# Patient Record
Sex: Male | Born: 1990 | Race: White | Hispanic: No | Marital: Single
Health system: Southern US, Community
[De-identification: ages and names within clinical notes are randomized; demographics above are authoritative.]

## PROBLEM LIST (undated history)

## (undated) DIAGNOSIS — J9621 Acute and chronic respiratory failure with hypoxia: Secondary | ICD-10-CM

## (undated) DIAGNOSIS — S069X9A Unspecified intracranial injury with loss of consciousness of unspecified duration, initial encounter: Secondary | ICD-10-CM

## (undated) DIAGNOSIS — F0781 Postconcussional syndrome: Secondary | ICD-10-CM

## (undated) DIAGNOSIS — S06309S Unspecified focal traumatic brain injury with loss of consciousness of unspecified duration, sequela: Secondary | ICD-10-CM

## (undated) DIAGNOSIS — I479 Paroxysmal tachycardia, unspecified: Secondary | ICD-10-CM

---

## 2019-02-23 ENCOUNTER — Other Ambulatory Visit (HOSPITAL_COMMUNITY): Payer: BC Managed Care – PPO

## 2019-02-23 ENCOUNTER — Inpatient Hospital Stay
Admission: AD | Admit: 2019-02-23 | Discharge: 2019-03-23 | Disposition: A | Discharge status: Rehabilitation Facility | Payer: BC Managed Care – PPO | Source: Other Acute Inpatient Hospital | Attending: Internal Medicine | Admitting: Internal Medicine

## 2019-02-23 DIAGNOSIS — I479 Paroxysmal tachycardia, unspecified: Secondary | ICD-10-CM | POA: Diagnosis present

## 2019-02-23 DIAGNOSIS — R52 Pain, unspecified: Secondary | ICD-10-CM

## 2019-02-23 DIAGNOSIS — S069X9A Unspecified intracranial injury with loss of consciousness of unspecified duration, initial encounter: Secondary | ICD-10-CM | POA: Diagnosis present

## 2019-02-23 DIAGNOSIS — Z431 Encounter for attention to gastrostomy: Secondary | ICD-10-CM

## 2019-02-23 DIAGNOSIS — F0781 Postconcussional syndrome: Secondary | ICD-10-CM | POA: Diagnosis present

## 2019-02-23 DIAGNOSIS — J9621 Acute and chronic respiratory failure with hypoxia: Secondary | ICD-10-CM | POA: Diagnosis present

## 2019-02-23 DIAGNOSIS — T17908A Unspecified foreign body in respiratory tract, part unspecified causing other injury, initial encounter: Secondary | ICD-10-CM

## 2019-02-23 DIAGNOSIS — S06309S Unspecified focal traumatic brain injury with loss of consciousness of unspecified duration, sequela: Secondary | ICD-10-CM

## 2019-02-23 DIAGNOSIS — Z931 Gastrostomy status: Secondary | ICD-10-CM

## 2019-02-23 DIAGNOSIS — T1490XA Injury, unspecified, initial encounter: Secondary | ICD-10-CM

## 2019-02-23 DIAGNOSIS — J9690 Respiratory failure, unspecified, unspecified whether with hypoxia or hypercapnia: Secondary | ICD-10-CM

## 2019-02-23 HISTORY — DX: Unspecified focal traumatic brain injury with loss of consciousness of unspecified duration, sequela: S06.309S

## 2019-02-23 HISTORY — DX: Postconcussional syndrome: F07.81

## 2019-02-23 HISTORY — DX: Unspecified intracranial injury with loss of consciousness of unspecified duration, initial encounter: S06.9X9A

## 2019-02-23 HISTORY — DX: Paroxysmal tachycardia, unspecified: I47.9

## 2019-02-23 HISTORY — DX: Acute and chronic respiratory failure with hypoxia: J96.21

## 2019-02-23 MED ORDER — IOHEXOL 300 MG/ML  SOLN: 25.0000 mL | Freq: Once | INTRAMUSCULAR | Status: DC | PRN

## 2019-02-24 LAB — COMPREHENSIVE METABOLIC PANEL
ALT: 23 U/L (ref 0–44)
AST: 22 U/L (ref 15–41)
Albumin: 2.7 g/dL — ABNORMAL LOW (ref 3.5–5.0)
Alkaline Phosphatase: 75 U/L (ref 38–126)
Anion gap: 12 (ref 5–15)
BUN: 19 mg/dL (ref 6–20)
CO2: 27 mmol/L (ref 22–32)
Calcium: 9.3 mg/dL (ref 8.9–10.3)
Chloride: 98 mmol/L (ref 98–111)
Creatinine, Ser: 0.55 mg/dL — ABNORMAL LOW (ref 0.61–1.24)
GFR calc Af Amer: >60 mL/min (ref >60)
GFR calc non Af Amer: >60 mL/min (ref >60)
Glucose, Bld: 120 mg/dL — ABNORMAL HIGH (ref 70–99)
Potassium: 4.4 mmol/L (ref 3.5–5.1)
Sodium: 137 mmol/L (ref 135–145)
Total Bilirubin: 0.5 mg/dL (ref 0.3–1.2)
Total Protein: 6.9 g/dL (ref 6.5–8.1)

## 2019-02-24 LAB — CBC
HCT: 28.7 % — ABNORMAL LOW (ref 39.0–52.0)
Hemoglobin: 9.2 g/dL — ABNORMAL LOW (ref 13.0–17.0)
MCH: 29.5 pg (ref 26.0–34.0)
MCHC: 32.1 g/dL (ref 30.0–36.0)
MCV: 92 fL (ref 80.0–100.0)
Platelets: 613 10*3/uL — ABNORMAL HIGH (ref 150–400)
RBC: 3.12 MIL/uL — ABNORMAL LOW (ref 4.22–5.81)
RDW: 12.3 % (ref 11.5–15.5)
WBC: 10.6 10*3/uL — ABNORMAL HIGH (ref 4.0–10.5)
nRBC: 0 % (ref 0.0–0.2)

## 2019-02-24 LAB — PROTIME-INR
INR: 1.2 (ref 0.8–1.2)
Prothrombin Time: 15.3 seconds — ABNORMAL HIGH (ref 11.4–15.2)

## 2019-02-25 LAB — VANCOMYCIN, TROUGH: Vancomycin Tr: 9 ug/mL — ABNORMAL LOW (ref 15–20)

## 2019-02-26 DIAGNOSIS — J9621 Acute and chronic respiratory failure with hypoxia: Secondary | ICD-10-CM | POA: Diagnosis not present

## 2019-02-26 DIAGNOSIS — F0781 Postconcussional syndrome: Secondary | ICD-10-CM | POA: Diagnosis not present

## 2019-02-26 DIAGNOSIS — S06309S Unspecified focal traumatic brain injury with loss of consciousness of unspecified duration, sequela: Secondary | ICD-10-CM

## 2019-02-26 DIAGNOSIS — I479 Paroxysmal tachycardia, unspecified: Secondary | ICD-10-CM

## 2019-02-26 MED ORDER — GENERIC EXTERNAL MEDICATION
Status: DC
Start: ? — End: 2019-02-26

## 2019-02-26 MED ORDER — RISPERIDONE 1 MG PO TABS
4.00 | ORAL_TABLET | ORAL | Status: DC
Start: 2019-02-23 — End: 2019-02-26

## 2019-02-26 MED ORDER — CYCLOBENZAPRINE HCL 5 MG PO TABS
10.00 | ORAL_TABLET | ORAL | Status: DC
Start: ? — End: 2019-02-26

## 2019-02-26 MED ORDER — PROPRANOLOL HCL 40 MG PO TABS
80.00 | ORAL_TABLET | ORAL | Status: DC
Start: 2019-02-23 — End: 2019-02-26

## 2019-02-26 MED ORDER — GENERIC EXTERNAL MEDICATION
5.00 | Status: DC
Start: ? — End: 2019-02-26

## 2019-02-26 MED ORDER — GENERIC EXTERNAL MEDICATION
Status: DC
Start: 2019-02-23 — End: 2019-02-26

## 2019-02-26 MED ORDER — CHLORHEXIDINE GLUCONATE 0.12 % MT SOLN
15.00 | OROMUCOSAL | Status: DC
Start: 2019-02-23 — End: 2019-02-26

## 2019-02-26 MED ORDER — HYDRALAZINE HCL 20 MG/ML IJ SOLN
5.00 | INTRAMUSCULAR | Status: DC
Start: ? — End: 2019-02-26

## 2019-02-26 MED ORDER — CARBOXYMETHYLCELLULOSE SODIUM 0.5 % OP SOLN
1.00 | OPHTHALMIC | Status: DC
Start: ? — End: 2019-02-26

## 2019-02-26 MED ORDER — ENOXAPARIN SODIUM 30 MG/0.3ML ~~LOC~~ SOLN
30.00 | SUBCUTANEOUS | Status: DC
Start: 2019-02-23 — End: 2019-02-26

## 2019-02-26 MED ORDER — LABETALOL HCL 5 MG/ML IV SOLN
10.00 | INTRAVENOUS | Status: DC
Start: ? — End: 2019-02-26

## 2019-02-26 MED ORDER — BACITRACIN ZINC 500 UNIT/GM EX OINT
TOPICAL_OINTMENT | CUTANEOUS | Status: DC
Start: 2019-02-23 — End: 2019-02-26

## 2019-02-26 MED ORDER — CLONIDINE HCL 0.1 MG PO TABS
0.10 | ORAL_TABLET | ORAL | Status: DC
Start: 2019-02-23 — End: 2019-02-26

## 2019-02-26 MED ORDER — ERYTHROMYCIN 5 MG/GM OP OINT
TOPICAL_OINTMENT | OPHTHALMIC | Status: DC
Start: 2019-02-23 — End: 2019-02-26

## 2019-02-26 MED ORDER — CHLORHEXIDINE GLUCONATE 0.12 % MT SOLN
15.00 | OROMUCOSAL | Status: DC
Start: ? — End: 2019-02-26

## 2019-02-26 MED ORDER — FAMOTIDINE 20 MG PO TABS
20.00 | ORAL_TABLET | ORAL | Status: DC
Start: 2019-02-23 — End: 2019-02-26

## 2019-02-26 MED ORDER — HYDROCODONE-ACETAMINOPHEN 5-325 MG PO TABS
1.00 | ORAL_TABLET | ORAL | Status: DC
Start: ? — End: 2019-02-26

## 2019-02-26 MED ORDER — CARBOXYMETHYLCELLULOSE SODIUM 0.5 % OP SOLN
1.00 | OPHTHALMIC | Status: DC
Start: 2019-02-23 — End: 2019-02-26

## 2019-02-26 MED ORDER — ACETAMINOPHEN 325 MG PO TABS
325.00 | ORAL_TABLET | ORAL | Status: DC
Start: 2019-02-23 — End: 2019-02-26

## 2019-02-26 MED ORDER — SODIUM CHLORIDE 1 G PO TABS
1000.00 | ORAL_TABLET | ORAL | Status: DC
Start: 2019-02-23 — End: 2019-02-26
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
## 2019-02-27 DIAGNOSIS — F0781 Postconcussional syndrome: Secondary | ICD-10-CM | POA: Diagnosis not present

## 2019-02-27 DIAGNOSIS — J9621 Acute and chronic respiratory failure with hypoxia: Secondary | ICD-10-CM | POA: Diagnosis not present

## 2019-02-27 DIAGNOSIS — I479 Paroxysmal tachycardia, unspecified: Secondary | ICD-10-CM | POA: Diagnosis not present

## 2019-02-27 DIAGNOSIS — S06309S Unspecified focal traumatic brain injury with loss of consciousness of unspecified duration, sequela: Secondary | ICD-10-CM | POA: Diagnosis not present

## 2019-02-27 LAB — VANCOMYCIN, TROUGH: Vancomycin Tr: 17 ug/mL (ref 15–20)
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
## 2019-02-28 ENCOUNTER — Encounter: Payer: Self-pay | Admitting: Physician Assistant

## 2019-02-28 ENCOUNTER — Other Ambulatory Visit (HOSPITAL_COMMUNITY): Payer: BC Managed Care – PPO

## 2019-02-28 DIAGNOSIS — S06309S Unspecified focal traumatic brain injury with loss of consciousness of unspecified duration, sequela: Secondary | ICD-10-CM | POA: Diagnosis not present

## 2019-02-28 DIAGNOSIS — F0781 Postconcussional syndrome: Secondary | ICD-10-CM | POA: Diagnosis not present

## 2019-02-28 DIAGNOSIS — I479 Paroxysmal tachycardia, unspecified: Secondary | ICD-10-CM | POA: Diagnosis not present

## 2019-02-28 DIAGNOSIS — J9621 Acute and chronic respiratory failure with hypoxia: Secondary | ICD-10-CM | POA: Diagnosis not present

## 2019-02-28 LAB — CULTURE, RESPIRATORY W GRAM STAIN

## 2019-02-28 LAB — SARS CORONAVIRUS 2 (TAT 6-24 HRS): SARS Coronavirus 2: NEGATIVE

## 2019-02-28 MED ORDER — GENERIC EXTERNAL MEDICATION
Status: DC
Start: ? — End: 2019-02-28

## 2019-02-28 NOTE — Progress Notes (Addendum)
[facility]   PULMONARY CRITICAL CARE SERVICE  PROGRESS NOTE  Date of Service: 02/28/2019  Mathew Klein  RUE:[id number]  DOB: 1990/10/04   DOA: 02/23/2019  Referring Physician: Carron CurieAli Hijazi, MD  HPI: Mathew Klein is a 28 y.o. male seen for follow up of Acute on Chronic Respiratory Failure.  Patient remains on aerosol trach collar 20% FiO2 satting well at this time.  Medications: Reviewed on Rounds  Physical Exam:  Vitals: Pulse 79 respirations 30 BP 121/60 O2 sat 100% temp 97.9  Ventilator Settings 28% ATC.  Marland Kitchen. General: Comfortable at this time . Eyes: Grossly normal lids, irises & conjunctiva . ENT: grossly tongue is normal . Neck: no obvious mass . Cardiovascular: S1 S2 normal no gallop . Respiratory: No rales or rhonchi noted . Abdomen: soft . Skin: no rash seen on limited exam . Musculoskeletal: not rigid . Psychiatric:unable to assess . Neurologic: no seizure no involuntary movements         Lab Data:   Basic Metabolic Panel: Recent Labs  Lab 02/24/19 0346  NA 137  K 4.4  CL 98  CO2 27  GLUCOSE 120*  BUN 19  CREATININE 0.55*  CALCIUM 9.3    ABG: No results for input(s): PHART, PCO2ART, PO2ART, HCO3, O2SAT in the last 168 hours.  Liver Function Tests: Recent Labs  Lab 02/24/19 0346  AST 22  ALT 23  ALKPHOS 75  BILITOT 0.5  PROT 6.9  ALBUMIN 2.7*   No results for input(s): LIPASE, AMYLASE in the last 168 hours. No results for input(s): AMMONIA in the last 168 hours.  CBC: Recent Labs  Lab 02/24/19 0346  WBC 10.6*  HGB 9.2*  HCT 28.7*  MCV 92.0  PLT 613*    Cardiac Enzymes: No results for input(s): CKTOTAL, CKMB, CKMBINDEX, TROPONINI in the last 168 hours.  BNP (last 3 results) No results for input(s): BNP in the last 8760 hours.  ProBNP (last 3 results) No results for input(s): PROBNP in the last 8760 hours.  Radiological Exams: Ct Abdomen Wo Contrast  Result  Date: 02/28/2019 CLINICAL DATA:  Respiratory failure, preop planning for gastrostomy placement EXAM: CT ABDOMEN WITHOUT CONTRAST TECHNIQUE: Multidetector CT imaging of the abdomen was performed following the standard protocol without IV contrast. COMPARISON:  None. FINDINGS: Lower chest: Patchy areas of consolidation/atelectasis posteriorly in the visualized lung bases. Blood pool is less dense than the intraventricular septum suggesting anemia. Hepatobiliary: No focal liver abnormality is seen. No gallstones, gallbladder wall thickening, or biliary dilatation. Pancreas: Unremarkable. No pancreatic ductal dilatation or surrounding inflammatory changes. Spleen: Normal in size without focal abnormality. Adrenals/Urinary Tract: Adrenal glands are unremarkable. Kidneys are normal, without renal calculi, focal lesion, or hydronephrosis. Stomach/Bowel: Nasal jejunal feeding tube to the proximal jejunum. There is a safe percutaneous window for gastrostomy placement. Stomach is nondistended. Visualized portions of small bowel and colon are unremarkable. Vascular/Lymphatic: No significant vascular findings are present. No enlarged abdominal or pelvic lymph nodes. Other: Surgical clips anterior to the right kidney. No ascites. No free air. Musculoskeletal: No acute or significant osseous findings. IMPRESSION: 1. There is a safe percutaneous window for gastrostomy placement. 2. Patchy areas of consolidation/atelectasis posteriorly in the visualized lung bases. Electronically Signed   By: Corlis Leak  Hassell M.D.   On: 02/28/2019 07:43    Assessment/Plan Active Problems:   * No active hospital problems. *   1. Acute on chronic respiratory failure with hypoxia patient is doing fairly well on  the T collar wean.  Secretions are fair to moderate.  We will continue with pulmonary toilet secretion management. 2. Traumatic brain injury remains grossly unchanged we will continue to monitor closely 3. Chronic posttraumatic  encephalopathy grossly unchanged supportive care 4. Paroxysmal tachycardia rate controlled to right now 5. Traumatic intracranial hemorrhage unchanged we will continue with supportive care   I have personally seen and evaluated the patient, evaluated laboratory and imaging results, formulated the assessment and plan and placed orders. The Patient requires high complexity decision making for assessment and support.  Case was discussed on Rounds with the Respiratory Therapy Staff  Allyne Gee, MD [facility] Pulmonary Critical Care Medicine Sleep Medicine
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
## 2019-03-01 ENCOUNTER — Institutional Professional Consult (permissible substitution) (HOSPITAL_COMMUNITY): Payer: BC Managed Care – PPO

## 2019-03-01 ENCOUNTER — Encounter (HOSPITAL_COMMUNITY): Payer: Self-pay | Admitting: Diagnostic Radiology

## 2019-03-01 DIAGNOSIS — I479 Paroxysmal tachycardia, unspecified: Secondary | ICD-10-CM | POA: Diagnosis not present

## 2019-03-01 DIAGNOSIS — F0781 Postconcussional syndrome: Secondary | ICD-10-CM | POA: Diagnosis not present

## 2019-03-01 DIAGNOSIS — S06309S Unspecified focal traumatic brain injury with loss of consciousness of unspecified duration, sequela: Secondary | ICD-10-CM | POA: Diagnosis not present

## 2019-03-01 DIAGNOSIS — J9621 Acute and chronic respiratory failure with hypoxia: Secondary | ICD-10-CM | POA: Diagnosis not present

## 2019-03-01 HISTORY — PX: IR GASTROSTOMY TUBE MOD SED: IMG625

## 2019-03-01 LAB — CBC
HCT: 30.8 % — ABNORMAL LOW (ref 39.0–52.0)
Hemoglobin: 9.9 g/dL — ABNORMAL LOW (ref 13.0–17.0)
MCH: 29 pg (ref 26.0–34.0)
MCHC: 32.1 g/dL (ref 30.0–36.0)
MCV: 90.3 fL (ref 80.0–100.0)
Platelets: 361 10*3/uL (ref 150–400)
RBC: 3.41 MIL/uL — ABNORMAL LOW (ref 4.22–5.81)
RDW: 13 % (ref 11.5–15.5)
WBC: 6.1 10*3/uL (ref 4.0–10.5)
nRBC: 0 % (ref 0.0–0.2)

## 2019-03-01 LAB — PROTIME-INR
INR: 1.2 (ref 0.8–1.2)
Prothrombin Time: 14.9 seconds (ref 11.4–15.2)

## 2019-03-01 MED ORDER — GENERIC EXTERNAL MEDICATION
Status: DC
Start: ? — End: 2019-03-01

## 2019-03-01 MED ORDER — MIDAZOLAM HCL 2 MG/2ML IJ SOLN
INTRAMUSCULAR | Status: AC | PRN
  Administered 2019-03-01 (×2): 1 mg via INTRAVENOUS

## 2019-03-01 MED ORDER — GLUCAGON HCL RDNA (DIAGNOSTIC) 1 MG IJ SOLR
INTRAMUSCULAR | Status: AC | PRN
  Administered 2019-03-01: 1 mg via INTRAVENOUS

## 2019-03-01 MED ORDER — MIDAZOLAM HCL 2 MG/2ML IJ SOLN
INTRAMUSCULAR | Status: AC
  Filled 2019-03-01: qty 2

## 2019-03-01 MED ORDER — FENTANYL CITRATE (PF) 100 MCG/2ML IJ SOLN
INTRAMUSCULAR | Status: AC | PRN
  Administered 2019-03-01 (×2): 50 ug via INTRAVENOUS

## 2019-03-01 MED ORDER — IOHEXOL 300 MG/ML  SOLN
50.0000 mL | Freq: Once | INTRAMUSCULAR | Status: AC | PRN
  Administered 2019-03-01: 10:00:00 15 mL

## 2019-03-01 MED ORDER — FENTANYL CITRATE (PF) 100 MCG/2ML IJ SOLN
INTRAMUSCULAR | Status: AC
  Filled 2019-03-01: qty 2

## 2019-03-01 MED ORDER — CEFAZOLIN SODIUM-DEXTROSE 2-4 GM/100ML-% IV SOLN
2.0000 g | Freq: Once | INTRAVENOUS | Status: AC
  Administered 2019-03-01: 10:00:00 2 g via INTRAVENOUS

## 2019-03-01 MED ORDER — LIDOCAINE HCL 1 % IJ SOLN
INTRAMUSCULAR | Status: AC
  Filled 2019-03-01: qty 20

## 2019-03-01 MED ORDER — GLUCAGON HCL RDNA (DIAGNOSTIC) 1 MG IJ SOLR
INTRAMUSCULAR | Status: AC
  Filled 2019-03-01: qty 1

## 2019-03-01 MED ORDER — CEFAZOLIN SODIUM-DEXTROSE 2-4 GM/100ML-% IV SOLN
INTRAVENOUS | Status: AC
  Administered 2019-03-01: 2 g via INTRAVENOUS
  Filled 2019-03-01: qty 100

## 2019-03-01 MED ORDER — LIDOCAINE HCL 1 % IJ SOLN
INTRAMUSCULAR | Status: AC | PRN
  Administered 2019-03-01 (×2): 10 mL

## 2019-03-01 NOTE — Procedures (Signed)
Interventional Radiology Procedure:   Indications: Post trauma with altered mental status  Procedure: Gastrostomy tube placement  Findings: 20 Fr gastrostomy in stomach  Complications: None     EBL: less than 20 ml  Plan: Ok to use feeding tube but wait to use Gastrostomy until 8/28.    Jodye Scali R. Anselm Pancoast, MD  Pager: [phone]

## 2019-03-01 NOTE — Sedation Documentation (Addendum)
Notified Dr. Anselm Pancoast patient awake, sitting straight up, attempting to pull at lines, unable to follow commands.    Unable to monitor EtCO2 due to trach
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
## 2019-03-02 DIAGNOSIS — J9621 Acute and chronic respiratory failure with hypoxia: Secondary | ICD-10-CM | POA: Diagnosis not present

## 2019-03-02 DIAGNOSIS — I479 Paroxysmal tachycardia, unspecified: Secondary | ICD-10-CM | POA: Diagnosis not present

## 2019-03-02 DIAGNOSIS — S06309S Unspecified focal traumatic brain injury with loss of consciousness of unspecified duration, sequela: Secondary | ICD-10-CM | POA: Diagnosis not present

## 2019-03-02 DIAGNOSIS — F0781 Postconcussional syndrome: Secondary | ICD-10-CM | POA: Diagnosis not present
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
## 2019-03-03 ENCOUNTER — Other Ambulatory Visit (HOSPITAL_COMMUNITY): Payer: BC Managed Care – PPO

## 2019-03-03 DIAGNOSIS — J9621 Acute and chronic respiratory failure with hypoxia: Secondary | ICD-10-CM | POA: Diagnosis not present

## 2019-03-03 DIAGNOSIS — I479 Paroxysmal tachycardia, unspecified: Secondary | ICD-10-CM | POA: Diagnosis not present

## 2019-03-03 DIAGNOSIS — S06309S Unspecified focal traumatic brain injury with loss of consciousness of unspecified duration, sequela: Secondary | ICD-10-CM | POA: Diagnosis not present

## 2019-03-03 DIAGNOSIS — F0781 Postconcussional syndrome: Secondary | ICD-10-CM | POA: Diagnosis not present

## 2019-03-03 LAB — CBC
HCT: 30.9 % — ABNORMAL LOW (ref 39.0–52.0)
Hemoglobin: 9.8 g/dL — ABNORMAL LOW (ref 13.0–17.0)
MCH: 29.1 pg (ref 26.0–34.0)
MCHC: 31.7 g/dL (ref 30.0–36.0)
MCV: 91.7 fL (ref 80.0–100.0)
Platelets: 246 10*3/uL (ref 150–400)
RBC: 3.37 MIL/uL — ABNORMAL LOW (ref 4.22–5.81)
RDW: 13.4 % (ref 11.5–15.5)
WBC: 5.5 10*3/uL (ref 4.0–10.5)
nRBC: 0 % (ref 0.0–0.2)

## 2019-03-03 LAB — BASIC METABOLIC PANEL
Anion gap: 9 (ref 5–15)
BUN: 6 mg/dL (ref 6–20)
CO2: 29 mmol/L (ref 22–32)
Calcium: 9.3 mg/dL (ref 8.9–10.3)
Chloride: 102 mmol/L (ref 98–111)
Creatinine, Ser: 0.57 mg/dL — ABNORMAL LOW (ref 0.61–1.24)
GFR calc Af Amer: >60 mL/min (ref >60)
GFR calc non Af Amer: >60 mL/min (ref >60)
Glucose, Bld: 108 mg/dL — ABNORMAL HIGH (ref 70–99)
Potassium: 4.3 mmol/L (ref 3.5–5.1)
Sodium: 140 mmol/L (ref 135–145)

## 2019-03-03 LAB — MAGNESIUM: Magnesium: 2 mg/dL (ref 1.7–2.4)

## 2019-03-03 MED ORDER — GENERIC EXTERNAL MEDICATION
Status: DC
Start: ? — End: 2019-03-03
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
## 2019-03-04 DIAGNOSIS — I479 Paroxysmal tachycardia, unspecified: Secondary | ICD-10-CM | POA: Diagnosis not present

## 2019-03-04 DIAGNOSIS — F0781 Postconcussional syndrome: Secondary | ICD-10-CM | POA: Diagnosis not present

## 2019-03-04 DIAGNOSIS — J9621 Acute and chronic respiratory failure with hypoxia: Secondary | ICD-10-CM | POA: Diagnosis not present

## 2019-03-04 DIAGNOSIS — S06309S Unspecified focal traumatic brain injury with loss of consciousness of unspecified duration, sequela: Secondary | ICD-10-CM | POA: Diagnosis not present

## 2019-03-04 LAB — URINALYSIS, ROUTINE W REFLEX MICROSCOPIC
Bilirubin Urine: NEGATIVE
Glucose, UA: NEGATIVE mg/dL
Hgb urine dipstick: NEGATIVE
Ketones, ur: NEGATIVE mg/dL
Leukocytes,Ua: NEGATIVE
Nitrite: NEGATIVE
Protein, ur: NEGATIVE mg/dL
Specific Gravity, Urine: 1.013 (ref 1.005–1.030)
pH: 7 (ref 5.0–8.0)
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
## 2019-03-05 ENCOUNTER — Encounter: Payer: Self-pay | Admitting: Internal Medicine

## 2019-03-05 DIAGNOSIS — F0781 Postconcussional syndrome: Secondary | ICD-10-CM | POA: Diagnosis present

## 2019-03-05 DIAGNOSIS — I479 Paroxysmal tachycardia, unspecified: Secondary | ICD-10-CM | POA: Diagnosis not present

## 2019-03-05 DIAGNOSIS — S069X9A Unspecified intracranial injury with loss of consciousness of unspecified duration, initial encounter: Secondary | ICD-10-CM | POA: Diagnosis present

## 2019-03-05 DIAGNOSIS — J9621 Acute and chronic respiratory failure with hypoxia: Secondary | ICD-10-CM | POA: Diagnosis not present

## 2019-03-05 DIAGNOSIS — S06309S Unspecified focal traumatic brain injury with loss of consciousness of unspecified duration, sequela: Secondary | ICD-10-CM | POA: Diagnosis not present

## 2019-03-05 LAB — URINE CULTURE: Culture: NO GROWTH

## 2019-03-05 NOTE — Progress Notes (Addendum)
Pulmonary Critical Care Medicine Mathew Klein   PULMONARY CRITICAL CARE SERVICE  PROGRESS NOTE  Date of Service: 03/05/2019  Mathew Klein  [id number]  DOB: 1990-07-22   DOA: 02/23/2019  Referring Physician: Merton Border, MD  HPI: Mathew Klein is a 28 y.o. male seen for follow up of Acute on Chronic Respiratory Failure.  Patient remains on 21% aerosol trach collar use PMV with no difficulty satting well.  Medications: Reviewed on Rounds  Physical Exam:  Vitals: Pulse 40 respirations 31 BP 145/81 O2 sat 100% temp 97.8  Ventilator Settings 12% ATC  . General: Comfortable at this time . Eyes: Grossly normal lids, irises & conjunctiva . ENT: grossly tongue is normal . Neck: no obvious mass . Cardiovascular: S1 S2 normal no gallop . Respiratory: No rales or rhonchi noted . Abdomen: soft . Skin: no rash seen on limited exam . Musculoskeletal: not rigid . Psychiatric:unable to assess . Neurologic: no seizure no involuntary movements         Lab Data:   Basic Metabolic Panel: Recent Labs  Lab 03/03/19 0729  NA 140  K 4.3  CL 102  CO2 29  GLUCOSE 108*  BUN 6  CREATININE 0.57*  CALCIUM 9.3  MG 2.0    ABG: No results for input(s): PHART, PCO2ART, PO2ART, HCO3, O2SAT in the last 168 hours.  Liver Function Tests: No results for input(s): AST, ALT, ALKPHOS, BILITOT, PROT, ALBUMIN in the last 168 hours. No results for input(s): LIPASE, AMYLASE in the last 168 hours. No results for input(s): AMMONIA in the last 168 hours.  CBC: Recent Labs  Lab 03/01/19 0712 03/03/19 0729  WBC 6.1 5.5  HGB 9.9* 9.8*  HCT 30.8* 30.9*  MCV 90.3 91.7  PLT 361 246    Cardiac Enzymes: No results for input(s): CKTOTAL, CKMB, CKMBINDEX, TROPONINI in the last 168 hours.  BNP (last 3 results) No results for input(s): BNP in the last 8760 hours.  ProBNP (last 3 results) No results for input(s): PROBNP in the last 8760 hours.  Radiological  Exams: No results found.  Assessment/Plan Active Problems:   Acute on chronic respiratory failure with hypoxia (HCC)   Traumatic brain injury with loss of consciousness (HCC)   Chronic post traumatic encephalopathy   Paroxysmal tachycardia, unspecified (HCC)   Traumatic intracranial hemorrhage with loss of consciousness, sequela ([place])   1. Acute on chronic respiratory failure with hypoxia patient is doing fairly well on the T collar wean. Secretions are fair to moderate. We will continue with pulmonary toilet secretion management. 2. Traumatic brain injury remains grossly unchanged we will continue to monitor closely 3. Chronic posttraumatic encephalopathy grossly unchanged supportive care 4. Paroxysmal tachycardia rate controlled to right now 5. Traumatic intracranial hemorrhage unchanged we will continue with supportive care   I have personally seen and evaluated the patient, evaluated laboratory and imaging results, formulated the assessment and plan and placed orders. The Patient requires high complexity decision making for assessment and support.  Case was discussed on Rounds with the Respiratory Therapy Staff  Allyne Gee, MD [facility] Pulmonary Critical Care Medicine Sleep Medicine

## 2019-03-06 ENCOUNTER — Institutional Professional Consult (permissible substitution) (HOSPITAL_COMMUNITY): Payer: BC Managed Care – PPO

## 2019-03-06 ENCOUNTER — Other Ambulatory Visit (HOSPITAL_COMMUNITY): Payer: BC Managed Care – PPO

## 2019-03-06 DIAGNOSIS — J9621 Acute and chronic respiratory failure with hypoxia: Secondary | ICD-10-CM | POA: Diagnosis not present

## 2019-03-06 DIAGNOSIS — F0781 Postconcussional syndrome: Secondary | ICD-10-CM | POA: Diagnosis not present

## 2019-03-06 DIAGNOSIS — I479 Paroxysmal tachycardia, unspecified: Secondary | ICD-10-CM | POA: Diagnosis not present

## 2019-03-06 DIAGNOSIS — S06309S Unspecified focal traumatic brain injury with loss of consciousness of unspecified duration, sequela: Secondary | ICD-10-CM | POA: Diagnosis not present

## 2019-03-06 MED ORDER — GENERIC EXTERNAL MEDICATION
Status: DC
Start: ? — End: 2019-03-06

## 2019-03-06 NOTE — Progress Notes (Addendum)
Pulmonary Critical Care Medicine [place]   PULMONARY CRITICAL CARE SERVICE  PROGRESS NOTE  Date of Service: 03/06/2019  Mathew Klein  [id number]  DOB: 1990/11/16   DOA: 02/23/2019  Referring Physician: Merton Border, MD  HPI: Mathew Klein is a 28 y.o. male seen for follow up of Acute on Chronic Respiratory Failure.  Patient remains on 21% aerosol trach collar using PMV with difficulty.  Medications: Reviewed on Rounds  Physical Exam:  Vitals: Pulse 70 respirations 20 BP 103/54 O2 sat 100% temp 98.1  Ventilator Settings ATC 21%  . General: Comfortable at this time . Eyes: Grossly normal lids, irises & conjunctiva . ENT: grossly tongue is normal . Neck: no obvious mass . Cardiovascular: S1 S2 normal no gallop . Respiratory: No rales or rhonchi noted . Abdomen: soft . Skin: no rash seen on limited exam . Musculoskeletal: not rigid . Psychiatric:unable to assess . Neurologic: no seizure no involuntary movements         Lab Data:   Basic Metabolic Panel: Recent Labs  Lab 03/03/19 0729  NA 140  K 4.3  CL 102  CO2 29  GLUCOSE 108*  BUN 6  CREATININE 0.57*  CALCIUM 9.3  MG 2.0    ABG: No results for input(s): PHART, PCO2ART, PO2ART, HCO3, O2SAT in the last 168 hours.  Liver Function Tests: No results for input(s): AST, ALT, ALKPHOS, BILITOT, PROT, ALBUMIN in the last 168 hours. No results for input(s): LIPASE, AMYLASE in the last 168 hours. No results for input(s): AMMONIA in the last 168 hours.  CBC: Recent Labs  Lab 03/01/19 0712 03/03/19 0729  WBC 6.1 5.5  HGB 9.9* 9.8*  HCT 30.8* 30.9*  MCV 90.3 91.7  PLT 361 246    Cardiac Enzymes: No results for input(s): CKTOTAL, CKMB, CKMBINDEX, TROPONINI in the last 168 hours.  BNP (last 3 results) No results for input(s): BNP in the last 8760 hours.  ProBNP (last 3 results) No results for input(s): PROBNP in the last 8760 hours.  Radiological Exams: Dg Scapula  Right  Result Date: 03/06/2019 CLINICAL DATA:  Pain. EXAM: RIGHT SCAPULA - 2+ VIEWS COMPARISON:  None. FINDINGS: There is no evidence of fracture or other focal bone lesions. Soft tissues are unremarkable. IMPRESSION: Negative. Electronically Signed   By: Lorriane Shire M.D.   On: 03/06/2019 19:23   Dg Forearm Right  Result Date: 03/06/2019 CLINICAL DATA:  Injury EXAM: RIGHT FOREARM - 2 VIEW COMPARISON:  None. FINDINGS: No fracture or malalignment. Soft tissue edema. Catheter over the mid forearm. IMPRESSION: No acute osseous abnormality Electronically Signed   By: Donavan Foil M.D.   On: 03/06/2019 16:21    Assessment/Plan Active Problems:   Acute on chronic respiratory failure with hypoxia (HCC)   Traumatic brain injury with loss of consciousness (HCC)   Chronic post traumatic encephalopathy   Paroxysmal tachycardia, unspecified (HCC)   Traumatic intracranial hemorrhage with loss of consciousness, sequela ([place])   1. Acute on chronic respiratory failure with hypoxia patient is doing fairly well on the T collar wean. We will continue with pulmonary toilet secretion management. 2. Traumatic brain injury remains grossly unchanged we will continue to monitor closely 3. Chronic posttraumatic encephalopathy grossly unchanged supportive care 4. Paroxysmal tachycardia rate controlled to right now 5. Traumatic intracranial hemorrhage unchanged we will continue with supportive care   I have personally seen and evaluated the patient, evaluated laboratory and imaging results, formulated the assessment and plan and placed orders. The  Patient requires high complexity decision making for assessment and support.  Case was discussed on Rounds with the Respiratory Therapy Staff  Allyne Gee, MD [facility] Pulmonary Critical Care Medicine Sleep Medicine

## 2019-03-07 DIAGNOSIS — F0781 Postconcussional syndrome: Secondary | ICD-10-CM | POA: Diagnosis not present

## 2019-03-07 DIAGNOSIS — I479 Paroxysmal tachycardia, unspecified: Secondary | ICD-10-CM | POA: Diagnosis not present

## 2019-03-07 DIAGNOSIS — J9621 Acute and chronic respiratory failure with hypoxia: Secondary | ICD-10-CM | POA: Diagnosis not present

## 2019-03-07 DIAGNOSIS — S06309S Unspecified focal traumatic brain injury with loss of consciousness of unspecified duration, sequela: Secondary | ICD-10-CM | POA: Diagnosis not present

## 2019-03-07 NOTE — Progress Notes (Addendum)
Pulmonary Critical Care Medicine [place]   PULMONARY CRITICAL CARE SERVICE  PROGRESS NOTE  Date of Service: 03/07/2019  Mathew Klein  [id number]  DOB: 1991-01-01   DOA: 02/23/2019  Referring Physician: Merton Border, MD  HPI: Mathew Klein is a 28 y.o. male seen for follow up of Acute on Chronic Respiratory Failure.  Patient remains on 21% aerosol trach collar was downsized to a #6 cuffless today using PMV with no difficulty.  Medications: Reviewed on Rounds  Physical Exam:  Vitals: Pulse 90 respirations 21 BP 124/73 O2 sat 96% temp 98.4  Ventilator Settings 21% ATC  . General: Comfortable at this time . Eyes: Grossly normal lids, irises & conjunctiva . ENT: grossly tongue is normal . Neck: no obvious mass . Cardiovascular: S1 S2 normal no gallop . Respiratory: No rales or rhonchi noted . Abdomen: soft . Skin: no rash seen on limited exam . Musculoskeletal: not rigid . Psychiatric:unable to assess . Neurologic: no seizure no involuntary movements         Lab Data:   Basic Metabolic Panel: Recent Labs  Lab 03/03/19 0729  NA 140  K 4.3  CL 102  CO2 29  GLUCOSE 108*  BUN 6  CREATININE 0.57*  CALCIUM 9.3  MG 2.0    ABG: No results for input(s): PHART, PCO2ART, PO2ART, HCO3, O2SAT in the last 168 hours.  Liver Function Tests: No results for input(s): AST, ALT, ALKPHOS, BILITOT, PROT, ALBUMIN in the last 168 hours. No results for input(s): LIPASE, AMYLASE in the last 168 hours. No results for input(s): AMMONIA in the last 168 hours.  CBC: Recent Labs  Lab 03/01/19 0712 03/03/19 0729  WBC 6.1 5.5  HGB 9.9* 9.8*  HCT 30.8* 30.9*  MCV 90.3 91.7  PLT 361 246    Cardiac Enzymes: No results for input(s): CKTOTAL, CKMB, CKMBINDEX, TROPONINI in the last 168 hours.  BNP (last 3 results) No results for input(s): BNP in the last 8760 hours.  ProBNP (last 3 results) No results for input(s): PROBNP in the last 8760  hours.  Radiological Exams: Dg Scapula Right  Result Date: 03/06/2019 CLINICAL DATA:  Pain. EXAM: RIGHT SCAPULA - 2+ VIEWS COMPARISON:  None. FINDINGS: There is no evidence of fracture or other focal bone lesions. Soft tissues are unremarkable. IMPRESSION: Negative. Electronically Signed   By: Lorriane Shire M.D.   On: 03/06/2019 19:23   Dg Forearm Right  Result Date: 03/06/2019 CLINICAL DATA:  Injury EXAM: RIGHT FOREARM - 2 VIEW COMPARISON:  None. FINDINGS: No fracture or malalignment. Soft tissue edema. Catheter over the mid forearm. IMPRESSION: No acute osseous abnormality Electronically Signed   By: Donavan Foil M.D.   On: 03/06/2019 16:21    Assessment/Plan Active Problems:   Acute on chronic respiratory failure with hypoxia (HCC)   Traumatic brain injury with loss of consciousness (HCC)   Chronic post traumatic encephalopathy   Paroxysmal tachycardia, unspecified (HCC)   Traumatic intracranial hemorrhage with loss of consciousness, sequela ([place])   1. Acute on chronic respiratory failure with hypoxia patient is doing fairly well on the T collar wean. We will continue with pulmonary toilet secretion management. 2. Traumatic brain injury remains grossly unchanged we will continue to monitor closely 3. Chronic posttraumatic encephalopathy grossly unchanged supportive care 4. Paroxysmal tachycardia rate controlled to right now 5. Traumatic intracranial hemorrhage unchanged we will continue with supportive care   I have personally seen and evaluated the patient, evaluated laboratory and imaging results, formulated  the assessment and plan and placed orders. The Patient requires high complexity decision making for assessment and support.  Case was discussed on Rounds with the Respiratory Therapy Staff  Allyne Gee, MD [facility] Pulmonary Critical Care Medicine Sleep Medicine

## 2019-03-08 DIAGNOSIS — I479 Paroxysmal tachycardia, unspecified: Secondary | ICD-10-CM | POA: Diagnosis not present

## 2019-03-08 DIAGNOSIS — F0781 Postconcussional syndrome: Secondary | ICD-10-CM | POA: Diagnosis not present

## 2019-03-08 DIAGNOSIS — J9621 Acute and chronic respiratory failure with hypoxia: Secondary | ICD-10-CM | POA: Diagnosis not present

## 2019-03-08 DIAGNOSIS — S06309S Unspecified focal traumatic brain injury with loss of consciousness of unspecified duration, sequela: Secondary | ICD-10-CM | POA: Diagnosis not present

## 2019-03-08 LAB — BASIC METABOLIC PANEL
Anion gap: 10 (ref 5–15)
BUN: 10 mg/dL (ref 6–20)
CO2: 29 mmol/L (ref 22–32)
Calcium: 9.4 mg/dL (ref 8.9–10.3)
Chloride: 100 mmol/L (ref 98–111)
Creatinine, Ser: 0.56 mg/dL — ABNORMAL LOW (ref 0.61–1.24)
GFR calc Af Amer: >60 mL/min (ref >60)
GFR calc non Af Amer: >60 mL/min (ref >60)
Glucose, Bld: 103 mg/dL — ABNORMAL HIGH (ref 70–99)
Potassium: 4.1 mmol/L (ref 3.5–5.1)
Sodium: 139 mmol/L (ref 135–145)

## 2019-03-08 LAB — CBC
HCT: 33.7 % — ABNORMAL LOW (ref 39.0–52.0)
Hemoglobin: 10.5 g/dL — ABNORMAL LOW (ref 13.0–17.0)
MCH: 28.9 pg (ref 26.0–34.0)
MCHC: 31.2 g/dL (ref 30.0–36.0)
MCV: 92.8 fL (ref 80.0–100.0)
Platelets: 208 10*3/uL (ref 150–400)
RBC: 3.63 MIL/uL — ABNORMAL LOW (ref 4.22–5.81)
RDW: 14.2 % (ref 11.5–15.5)
WBC: 6.2 10*3/uL (ref 4.0–10.5)
nRBC: 0 % (ref 0.0–0.2)

## 2019-03-08 NOTE — Progress Notes (Addendum)
Pulmonary Critical Care Medicine [place]   PULMONARY CRITICAL CARE SERVICE  PROGRESS NOTE  Date of Service: 03/08/2019  Mathew Klein  [id number]:[id number]  DOB: Jun 19, 1991   DOA: 02/23/2019  Referring Physician: Merton Border, MD  HPI: Mathew Klein is a 28 y.o. male seen for follow up of Acute on Chronic Respiratory Failure.  Patient remains on aerosol trach collar 21% FiO2 using PMV with no difficulty.  Medications: Reviewed on Rounds  Physical Exam:  Vitals: Pulse 111 respirations 26 BP 132/75 O2 sat 95% temp 97.1  Ventilator Settings ATC 21%  . General: Comfortable at this time . Eyes: Grossly normal lids, irises & conjunctiva . ENT: grossly tongue is normal . Neck: no obvious mass . Cardiovascular: S1 S2 normal no gallop . Respiratory: No rales or rhonchi noted . Abdomen: soft . Skin: no rash seen on limited exam . Musculoskeletal: not rigid . Psychiatric:unable to assess . Neurologic: no seizure no involuntary movements         Lab Data:   Basic Metabolic Panel: Recent Labs  Lab 03/03/19 0729 03/08/19 0602  NA 140 139  K 4.3 4.1  CL 102 100  CO2 29 29  GLUCOSE 108* 103*  BUN 6 10  CREATININE 0.57* 0.56*  CALCIUM 9.3 9.4  MG 2.0  --     ABG: No results for input(s): PHART, PCO2ART, PO2ART, HCO3, O2SAT in the last 168 hours.  Liver Function Tests: No results for input(s): AST, ALT, ALKPHOS, BILITOT, PROT, ALBUMIN in the last 168 hours. No results for input(s): LIPASE, AMYLASE in the last 168 hours. No results for input(s): AMMONIA in the last 168 hours.  CBC: Recent Labs  Lab 03/03/19 0729 03/08/19 0602  WBC 5.5 6.2  HGB 9.8* 10.5*  HCT 30.9* 33.7*  MCV 91.7 92.8  PLT 246 208    Cardiac Enzymes: No results for input(s): CKTOTAL, CKMB, CKMBINDEX, TROPONINI in the last 168 hours.  BNP (last 3 results) No results for input(s): BNP in the last 8760 hours.  ProBNP (last 3 results) No results for input(s): PROBNP  in the last 8760 hours.  Radiological Exams: No results found.  Assessment/Plan Active Problems:   Acute on chronic respiratory failure with hypoxia (HCC)   Traumatic brain injury with loss of consciousness (HCC)   Chronic post traumatic encephalopathy   Paroxysmal tachycardia, unspecified (HCC)   Traumatic intracranial hemorrhage with loss of consciousness, sequela ([place])   1. Acute on chronic respiratory failure with hypoxia patient is doing fairly well on the T collar wean. We will continue with pulmonary toilet secretion management. 2. Traumatic brain injury remains grossly unchanged we will continue to monitor closely 3. Chronic posttraumatic encephalopathy grossly unchanged supportive care 4. Paroxysmal tachycardia rate controlled to right now 5. Traumatic intracranial hemorrhage unchanged we will continue with supportive care   I have personally seen and evaluated the patient, evaluated laboratory and imaging results, formulated the assessment and plan and placed orders. The Patient requires high complexity decision making for assessment and support.  Case was discussed on Rounds with the Respiratory Therapy Staff  Allyne Gee, MD [facility] Pulmonary Critical Care Medicine Sleep Medicine

## 2019-03-09 DIAGNOSIS — S06309S Unspecified focal traumatic brain injury with loss of consciousness of unspecified duration, sequela: Secondary | ICD-10-CM | POA: Diagnosis not present

## 2019-03-09 DIAGNOSIS — J9621 Acute and chronic respiratory failure with hypoxia: Secondary | ICD-10-CM | POA: Diagnosis not present

## 2019-03-09 DIAGNOSIS — I479 Paroxysmal tachycardia, unspecified: Secondary | ICD-10-CM | POA: Diagnosis not present

## 2019-03-09 DIAGNOSIS — F0781 Postconcussional syndrome: Secondary | ICD-10-CM | POA: Diagnosis not present

## 2019-03-09 NOTE — Progress Notes (Addendum)
Pulmonary Critical Care Medicine [place]   PULMONARY CRITICAL CARE SERVICE  PROGRESS NOTE  Date of Service: 03/09/2019  ACEY WOODFIELD  [id number]  DOB: Jun 16, 1991   DOA: 02/23/2019  Referring Physician: Merton Border, MD  HPI: Mathew Klein is a 28 y.o. male seen for follow up of Acute on Chronic Respiratory Failure.  Patient continues on aerosol trach collar 21% FiO2.  Satting well with no fever or distress.  Medications: Reviewed on Rounds  Physical Exam:  Vitals: Pulse 62 respirations 13 BP 112/67 O2 sat 99% temp 97.8  Ventilator Settings room air ATC  . General: Comfortable at this time . Eyes: Grossly normal lids, irises & conjunctiva . ENT: grossly tongue is normal . Neck: no obvious mass . Cardiovascular: S1 S2 normal no gallop . Respiratory: No rales or rhonchi noted . Abdomen: soft . Skin: no rash seen on limited exam . Musculoskeletal: not rigid . Psychiatric:unable to assess . Neurologic: no seizure no involuntary movements         Lab Data:   Basic Metabolic Panel: Recent Labs  Lab 03/03/19 0729 03/08/19 0602  NA 140 139  K 4.3 4.1  CL 102 100  CO2 29 29  GLUCOSE 108* 103*  BUN 6 10  CREATININE 0.57* 0.56*  CALCIUM 9.3 9.4  MG 2.0  --     ABG: No results for input(s): PHART, PCO2ART, PO2ART, HCO3, O2SAT in the last 168 hours.  Liver Function Tests: No results for input(s): AST, ALT, ALKPHOS, BILITOT, PROT, ALBUMIN in the last 168 hours. No results for input(s): LIPASE, AMYLASE in the last 168 hours. No results for input(s): AMMONIA in the last 168 hours.  CBC: Recent Labs  Lab 03/03/19 0729 03/08/19 0602  WBC 5.5 6.2  HGB 9.8* 10.5*  HCT 30.9* 33.7*  MCV 91.7 92.8  PLT 246 208    Cardiac Enzymes: No results for input(s): CKTOTAL, CKMB, CKMBINDEX, TROPONINI in the last 168 hours.  BNP (last 3 results) No results for input(s): BNP in the last 8760 hours.  ProBNP (last 3 results) No results  for input(s): PROBNP in the last 8760 hours.  Radiological Exams: No results found.  Assessment/Plan Active Problems:   Acute on chronic respiratory failure with hypoxia (HCC)   Traumatic brain injury with loss of consciousness (HCC)   Chronic post traumatic encephalopathy   Paroxysmal tachycardia, unspecified (HCC)   Traumatic intracranial hemorrhage with loss of consciousness, sequela ([place])   1. Acute on chronic respiratory failure with hypoxia patient is doing fairly well on the T collar wean. We will continue with pulmonary toilet secretion management. 2. Traumatic brain injury remains grossly unchanged we will continue to monitor closely 3. Chronic posttraumatic encephalopathy grossly unchanged supportive care 4. Paroxysmal tachycardia rate controlled to right now 5. Traumatic intracranial hemorrhage unchanged we will continue with supportive care   I have personally seen and evaluated the patient, evaluated laboratory and imaging results, formulated the assessment and plan and placed orders. The Patient requires high complexity decision making for assessment and support.  Case was discussed on Rounds with the Respiratory Therapy Staff  Allyne Gee, MD [facility] Pulmonary Critical Care Medicine Sleep Medicine

## 2019-03-10 DIAGNOSIS — S06309S Unspecified focal traumatic brain injury with loss of consciousness of unspecified duration, sequela: Secondary | ICD-10-CM | POA: Diagnosis not present

## 2019-03-10 DIAGNOSIS — I479 Paroxysmal tachycardia, unspecified: Secondary | ICD-10-CM | POA: Diagnosis not present

## 2019-03-10 DIAGNOSIS — F0781 Postconcussional syndrome: Secondary | ICD-10-CM | POA: Diagnosis not present

## 2019-03-10 DIAGNOSIS — J9621 Acute and chronic respiratory failure with hypoxia: Secondary | ICD-10-CM | POA: Diagnosis not present

## 2019-03-10 NOTE — Progress Notes (Addendum)
Pulmonary Critical Care Medicine [place]   PULMONARY CRITICAL CARE SERVICE  PROGRESS NOTE  Date of Service: 03/10/2019  Mathew Klein  GEX:[id number]  DOB: 17-Feb-1991   DOA: 02/23/2019  Referring Physician: Merton Border, MD  HPI: Mathew Klein is a 28 y.o. male seen for follow up of Acute on Chronic Respiratory Failure.  Patient continues on aerosol trach collar 21% FiO2 using PMV with no difficulty.   Medications: Reviewed on Rounds  Physical Exam:  Vitals: Pulse 85 respirations 19 BP 111/56 O2 sat 100% temp 98.9  Ventilator Settings AC 21%  . General: Comfortable at this time . Eyes: Grossly normal lids, irises & conjunctiva . ENT: grossly tongue is normal . Neck: no obvious mass . Cardiovascular: S1 S2 normal no gallop . Respiratory: No rales or rhonchi noted . Abdomen: soft . Skin: no rash seen on limited exam . Musculoskeletal: not rigid . Psychiatric:unable to assess . Neurologic: no seizure no involuntary movements         Lab Data:   Basic Metabolic Panel: Recent Labs  Lab 03/08/19 0602  NA 139  K 4.1  CL 100  CO2 29  GLUCOSE 103*  BUN 10  CREATININE 0.56*  CALCIUM 9.4    ABG: No results for input(s): PHART, PCO2ART, PO2ART, HCO3, O2SAT in the last 168 hours.  Liver Function Tests: No results for input(s): AST, ALT, ALKPHOS, BILITOT, PROT, ALBUMIN in the last 168 hours. No results for input(s): LIPASE, AMYLASE in the last 168 hours. No results for input(s): AMMONIA in the last 168 hours.  CBC: Recent Labs  Lab 03/08/19 0602  WBC 6.2  HGB 10.5*  HCT 33.7*  MCV 92.8  PLT 208    Cardiac Enzymes: No results for input(s): CKTOTAL, CKMB, CKMBINDEX, TROPONINI in the last 168 hours.  BNP (last 3 results) No results for input(s): BNP in the last 8760 hours.  ProBNP (last 3 results) No results for input(s): PROBNP in the last 8760 hours.  Radiological Exams: No results found.  Assessment/Plan Active  Problems:   Acute on chronic respiratory failure with hypoxia (HCC)   Traumatic brain injury with loss of consciousness (HCC)   Chronic post traumatic encephalopathy   Paroxysmal tachycardia, unspecified (HCC)   Traumatic intracranial hemorrhage with loss of consciousness, sequela ([place])   1. Acute on chronic respiratory failure with hypoxia we will continue with T collar trials titrate oxygen right now continue pulmonary toilet.  Patient's secretions are still significant 2. Traumatic brain injury grossly unchanged 3. Chronic posttraumatic encephalopathy at baseline 4. Paroxysmal tachycardia controlled 5. Traumatic intracranial hemorrhage as above at baseline   I have personally seen and evaluated the patient, evaluated laboratory and imaging results, formulated the assessment and plan and placed orders. The Patient requires high complexity decision making for assessment and support.  Case was discussed on Rounds with the Respiratory Therapy Staff  Allyne Gee, MD [facility] Pulmonary Critical Care Medicine Sleep Medicine

## 2019-03-11 DIAGNOSIS — J9621 Acute and chronic respiratory failure with hypoxia: Secondary | ICD-10-CM | POA: Diagnosis not present

## 2019-03-11 DIAGNOSIS — S06309S Unspecified focal traumatic brain injury with loss of consciousness of unspecified duration, sequela: Secondary | ICD-10-CM | POA: Diagnosis not present

## 2019-03-11 DIAGNOSIS — F0781 Postconcussional syndrome: Secondary | ICD-10-CM | POA: Diagnosis not present

## 2019-03-11 DIAGNOSIS — I479 Paroxysmal tachycardia, unspecified: Secondary | ICD-10-CM | POA: Diagnosis not present

## 2019-03-11 NOTE — Progress Notes (Addendum)
Pulmonary Critical Care Medicine [place]   PULMONARY CRITICAL CARE SERVICE  PROGRESS NOTE  Date of Service: 03/11/2019  Mathew Klein  [id number]  DOB: 02-Jun-1991   DOA: 02/23/2019  Referring Physician: Merton Border, MD  HPI: Mathew Klein is a 28 y.o. male seen for follow up of Acute on Chronic Respiratory Failure.  Patient remains on room air at this time using PMV satting well with no distress.  Medications: Reviewed on Rounds  Physical Exam:  Vitals: Pulse 87 respirations any 1 BP 132/73 O2 sat 98% temp 97.5  Ventilator Settings room air  . General: Comfortable at this time . Eyes: Grossly normal lids, irises & conjunctiva . ENT: grossly tongue is normal . Neck: no obvious mass . Cardiovascular: S1 S2 normal no gallop . Respiratory: No rales or rhonchi noted . Abdomen: soft . Skin: no rash seen on limited exam . Musculoskeletal: not rigid . Psychiatric:unable to assess . Neurologic: no seizure no involuntary movements         Lab Data:   Basic Metabolic Panel: Recent Labs  Lab 03/08/19 0602  NA 139  K 4.1  CL 100  CO2 29  GLUCOSE 103*  BUN 10  CREATININE 0.56*  CALCIUM 9.4    ABG: No results for input(s): PHART, PCO2ART, PO2ART, HCO3, O2SAT in the last 168 hours.  Liver Function Tests: No results for input(s): AST, ALT, ALKPHOS, BILITOT, PROT, ALBUMIN in the last 168 hours. No results for input(s): LIPASE, AMYLASE in the last 168 hours. No results for input(s): AMMONIA in the last 168 hours.  CBC: Recent Labs  Lab 03/08/19 0602  WBC 6.2  HGB 10.5*  HCT 33.7*  MCV 92.8  PLT 208    Cardiac Enzymes: No results for input(s): CKTOTAL, CKMB, CKMBINDEX, TROPONINI in the last 168 hours.  BNP (last 3 results) No results for input(s): BNP in the last 8760 hours.  ProBNP (last 3 results) No results for input(s): PROBNP in the last 8760 hours.  Radiological Exams: No results found.  Assessment/Plan Active  Problems:   Acute on chronic respiratory failure with hypoxia (HCC)   Traumatic brain injury with loss of consciousness (HCC)   Chronic post traumatic encephalopathy   Paroxysmal tachycardia, unspecified (HCC)   Traumatic intracranial hemorrhage with loss of consciousness, sequela ([place])   1. Acute on chronic respiratory failure with hypoxia we will continue with T collar trials titrate oxygen right now continue pulmonary toilet. Patient's secretions are still significant 2. Traumatic brain injury grossly unchanged 3. Chronic posttraumatic encephalopathy at baseline 4. Paroxysmal tachycardia controlled 5. Traumatic intracranial hemorrhage as above at baseline   I have personally seen and evaluated the patient, evaluated laboratory and imaging results, formulated the assessment and plan and placed orders. The Patient requires high complexity decision making for assessment and support.  Case was discussed on Rounds with the Respiratory Therapy Staff  Allyne Gee, MD [facility] Pulmonary Critical Care Medicine Sleep Medicine

## 2019-03-12 DIAGNOSIS — J9621 Acute and chronic respiratory failure with hypoxia: Secondary | ICD-10-CM | POA: Diagnosis not present

## 2019-03-12 DIAGNOSIS — I479 Paroxysmal tachycardia, unspecified: Secondary | ICD-10-CM | POA: Diagnosis not present

## 2019-03-12 DIAGNOSIS — S06309S Unspecified focal traumatic brain injury with loss of consciousness of unspecified duration, sequela: Secondary | ICD-10-CM | POA: Diagnosis not present

## 2019-03-12 DIAGNOSIS — F0781 Postconcussional syndrome: Secondary | ICD-10-CM | POA: Diagnosis not present

## 2019-03-12 NOTE — Progress Notes (Signed)
Pulmonary Critical Care Medicine [place]   PULMONARY CRITICAL CARE SERVICE  PROGRESS NOTE  Date of Service: 03/12/2019  WINTER TREFZ  [id number]:[id number]  DOB: 12/07/1990   DOA: 02/23/2019  Referring Physician: Merton Border, MD  HPI: Mathew Klein is a 28 y.o. male seen for follow up of Acute on Chronic Respiratory Failure.  Patient currently is on T collar has been on 21% FiO2  Medications: Reviewed on Rounds  Physical Exam:  Vitals: Temperature 97.2 pulse 111 respiratory 38 blood pressure 111/75 saturations 97%  Ventilator Settings off ventilator on T collar  . General: Comfortable at this time . Eyes: Grossly normal lids, irises & conjunctiva . ENT: grossly tongue is normal . Neck: no obvious mass . Cardiovascular: S1 S2 normal no gallop . Respiratory: No rhonchi no rales . Abdomen: soft . Skin: no rash seen on limited exam . Musculoskeletal: not rigid . Psychiatric:unable to assess . Neurologic: no seizure no involuntary movements         Lab Data:   Basic Metabolic Panel: Recent Labs  Lab 03/08/19 0602  NA 139  K 4.1  CL 100  CO2 29  GLUCOSE 103*  BUN 10  CREATININE 0.56*  CALCIUM 9.4    ABG: No results for input(s): PHART, PCO2ART, PO2ART, HCO3, O2SAT in the last 168 hours.  Liver Function Tests: No results for input(s): AST, ALT, ALKPHOS, BILITOT, PROT, ALBUMIN in the last 168 hours. No results for input(s): LIPASE, AMYLASE in the last 168 hours. No results for input(s): AMMONIA in the last 168 hours.  CBC: Recent Labs  Lab 03/08/19 0602  WBC 6.2  HGB 10.5*  HCT 33.7*  MCV 92.8  PLT 208    Cardiac Enzymes: No results for input(s): CKTOTAL, CKMB, CKMBINDEX, TROPONINI in the last 168 hours.  BNP (last 3 results) No results for input(s): BNP in the last 8760 hours.  ProBNP (last 3 results) No results for input(s): PROBNP in the last 8760 hours.  Radiological Exams: No results  found.  Assessment/Plan Active Problems:   Acute on chronic respiratory failure with hypoxia (HCC)   Traumatic brain injury with loss of consciousness (HCC)   Chronic post traumatic encephalopathy   Paroxysmal tachycardia, unspecified (HCC)   Traumatic intracranial hemorrhage with loss of consciousness, sequela ([place])   1. Acute on chronic respiratory failure with hypoxia we will continue with T collar trials titrate oxygen right now continue pulmonary toilet.  Patient's secretions are still significant 2. Traumatic brain injury grossly unchanged 3. Chronic posttraumatic encephalopathy at baseline 4. Paroxysmal tachycardia controlled 5. Traumatic intracranial hemorrhage as above at baseline   I have personally seen and evaluated the patient, evaluated laboratory and imaging results, formulated the assessment and plan and placed orders. The Patient requires high complexity decision making for assessment and support.  Case was discussed on Rounds with the Respiratory Therapy Staff  Allyne Gee, MD [facility] Pulmonary Critical Care Medicine Sleep Medicine

## 2019-03-13 DIAGNOSIS — I479 Paroxysmal tachycardia, unspecified: Secondary | ICD-10-CM | POA: Diagnosis not present

## 2019-03-13 DIAGNOSIS — S06309S Unspecified focal traumatic brain injury with loss of consciousness of unspecified duration, sequela: Secondary | ICD-10-CM | POA: Diagnosis not present

## 2019-03-13 DIAGNOSIS — F0781 Postconcussional syndrome: Secondary | ICD-10-CM | POA: Diagnosis not present

## 2019-03-13 DIAGNOSIS — J9621 Acute and chronic respiratory failure with hypoxia: Secondary | ICD-10-CM | POA: Diagnosis not present

## 2019-03-13 LAB — URINALYSIS, ROUTINE W REFLEX MICROSCOPIC
Bilirubin Urine: NEGATIVE
Glucose, UA: NEGATIVE mg/dL
Hgb urine dipstick: NEGATIVE
Ketones, ur: NEGATIVE mg/dL
Leukocytes,Ua: NEGATIVE
Nitrite: NEGATIVE
Protein, ur: NEGATIVE mg/dL
Specific Gravity, Urine: 1.015 (ref 1.005–1.030)
pH: 7 (ref 5.0–8.0)

## 2019-03-13 LAB — BASIC METABOLIC PANEL
Anion gap: 8 (ref 5–15)
BUN: 10 mg/dL (ref 6–20)
CO2: 30 mmol/L (ref 22–32)
Calcium: 9.8 mg/dL (ref 8.9–10.3)
Chloride: 100 mmol/L (ref 98–111)
Creatinine, Ser: 0.75 mg/dL (ref 0.61–1.24)
GFR calc Af Amer: >60 mL/min (ref >60)
GFR calc non Af Amer: >60 mL/min (ref >60)
Glucose, Bld: 118 mg/dL — ABNORMAL HIGH (ref 70–99)
Potassium: 4.2 mmol/L (ref 3.5–5.1)
Sodium: 138 mmol/L (ref 135–145)

## 2019-03-13 LAB — MAGNESIUM: Magnesium: 1.9 mg/dL (ref 1.7–2.4)

## 2019-03-13 LAB — CBC
HCT: 35.2 % — ABNORMAL LOW (ref 39.0–52.0)
Hemoglobin: 11.3 g/dL — ABNORMAL LOW (ref 13.0–17.0)
MCH: 29.3 pg (ref 26.0–34.0)
MCHC: 32.1 g/dL (ref 30.0–36.0)
MCV: 91.2 fL (ref 80.0–100.0)
Platelets: 280 10*3/uL (ref 150–400)
RBC: 3.86 MIL/uL — ABNORMAL LOW (ref 4.22–5.81)
RDW: 13.8 % (ref 11.5–15.5)
WBC: 5.6 10*3/uL (ref 4.0–10.5)
nRBC: 0 % (ref 0.0–0.2)

## 2019-03-13 NOTE — Progress Notes (Signed)
Pulmonary Critical Care Medicine [place]   PULMONARY CRITICAL CARE SERVICE  PROGRESS NOTE  Date of Service: 03/13/2019  Mathew Klein  [id number]  DOB: July 17, 1990   DOA: 02/23/2019  Referring Physician: Merton Border, MD  HPI: Mathew Klein is a 28 y.o. male seen for follow up of Acute on Chronic Respiratory Failure.  Patient currently is on T collar has been on room air is at baseline has not been able to handle secretions so therefore will likely need to keep the airway in place  Medications: Reviewed on Rounds  Physical Exam:  Vitals: Temperature 96.7 pulse 100 respiratory rate 22 blood pressure 123/73 saturations 100%  Ventilator Settings off the ventilator right now on T collar  . General: Comfortable at this time . Eyes: Grossly normal lids, irises & conjunctiva . ENT: grossly tongue is normal . Neck: no obvious mass . Cardiovascular: S1 S2 normal no gallop . Respiratory: No rhonchi no rales are noted . Abdomen: soft . Skin: no rash seen on limited exam . Musculoskeletal: not rigid . Psychiatric:unable to assess . Neurologic: no seizure no involuntary movements         Lab Data:   Basic Metabolic Panel: Recent Labs  Lab 03/08/19 0602 03/13/19 0617  NA 139 138  K 4.1 4.2  CL 100 100  CO2 29 30  GLUCOSE 103* 118*  BUN 10 10  CREATININE 0.56* 0.75  CALCIUM 9.4 9.8  MG  --  1.9    ABG: No results for input(s): PHART, PCO2ART, PO2ART, HCO3, O2SAT in the last 168 hours.  Liver Function Tests: No results for input(s): AST, ALT, ALKPHOS, BILITOT, PROT, ALBUMIN in the last 168 hours. No results for input(s): LIPASE, AMYLASE in the last 168 hours. No results for input(s): AMMONIA in the last 168 hours.  CBC: Recent Labs  Lab 03/08/19 0602 03/13/19 0617  WBC 6.2 5.6  HGB 10.5* 11.3*  HCT 33.7* 35.2*  MCV 92.8 91.2  PLT 208 280    Cardiac Enzymes: No results for input(s): CKTOTAL, CKMB, CKMBINDEX, TROPONINI in the  last 168 hours.  BNP (last 3 results) No results for input(s): BNP in the last 8760 hours.  ProBNP (last 3 results) No results for input(s): PROBNP in the last 8760 hours.  Radiological Exams: No results found.  Assessment/Plan Active Problems:   Acute on chronic respiratory failure with hypoxia (HCC)   Traumatic brain injury with loss of consciousness (HCC)   Chronic post traumatic encephalopathy   Paroxysmal tachycardia, unspecified (HCC)   Traumatic intracranial hemorrhage with loss of consciousness, sequela ([place])   1. Acute on chronic respiratory failure with hypoxia we will continue with T collar and supportive care. 2. Traumatic brain injury not able to handle secretions really no change noted. 3. Chronic encephalopathy posttraumatic we will continue with supportive care 4. Paroxysmal tachycardia rate controlled 5. Traumatic intracranial hemorrhage therapy is following along   I have personally seen and evaluated the patient, evaluated laboratory and imaging results, formulated the assessment and plan and placed orders. The Patient requires high complexity decision making for assessment and support.  Case was discussed on Rounds with the Respiratory Therapy Staff  Allyne Gee, MD [facility] Pulmonary Critical Care Medicine Sleep Medicine

## 2019-03-14 DIAGNOSIS — F0781 Postconcussional syndrome: Secondary | ICD-10-CM | POA: Diagnosis not present

## 2019-03-14 DIAGNOSIS — J9621 Acute and chronic respiratory failure with hypoxia: Secondary | ICD-10-CM | POA: Diagnosis not present

## 2019-03-14 DIAGNOSIS — S06309S Unspecified focal traumatic brain injury with loss of consciousness of unspecified duration, sequela: Secondary | ICD-10-CM | POA: Diagnosis not present

## 2019-03-14 DIAGNOSIS — I479 Paroxysmal tachycardia, unspecified: Secondary | ICD-10-CM | POA: Diagnosis not present

## 2019-03-14 NOTE — Progress Notes (Signed)
Pulmonary Critical Care Medicine [place]   PULMONARY CRITICAL CARE SERVICE  PROGRESS NOTE  Date of Service: 03/14/2019  [name]  [id number]  DOB: 08/07/1990   DOA: 02/23/2019  Referring Physician: Merton Border, MD  HPI: Mathew Klein is a 28 y.o. male seen for follow up of Acute on Chronic Respiratory Failure.  Patient currently is on T collar has been on 20% FiO2 with excellent saturations secretions are fair to moderate  Medications: Reviewed on Rounds  Physical Exam:  Vitals: Temperature 97.4 pulse 73 respiratory rate 24 blood pressure is 138/81 saturations 97%  Ventilator Settings on T collar currently  . General: Comfortable at this time . Eyes: Grossly normal lids, irises & conjunctiva . ENT: grossly tongue is normal . Neck: no obvious mass . Cardiovascular: S1 S2 normal no gallop . Respiratory: No rhonchi no rales are noted . Abdomen: soft . Skin: no rash seen on limited exam . Musculoskeletal: not rigid . Psychiatric:unable to assess . Neurologic: no seizure no involuntary movements         Lab Data:   Basic Metabolic Panel: Recent Labs  Lab 03/08/19 0602 03/13/19 0617  NA 139 138  K 4.1 4.2  CL 100 100  CO2 29 30  GLUCOSE 103* 118*  BUN 10 10  CREATININE 0.56* 0.75  CALCIUM 9.4 9.8  MG  --  1.9    ABG: No results for input(s): PHART, PCO2ART, PO2ART, HCO3, O2SAT in the last 168 hours.  Liver Function Tests: No results for input(s): AST, ALT, ALKPHOS, BILITOT, PROT, ALBUMIN in the last 168 hours. No results for input(s): LIPASE, AMYLASE in the last 168 hours. No results for input(s): AMMONIA in the last 168 hours.  CBC: Recent Labs  Lab 03/08/19 0602 03/13/19 0617  WBC 6.2 5.6  HGB 10.5* 11.3*  HCT 33.7* 35.2*  MCV 92.8 91.2  PLT 208 280    Cardiac Enzymes: No results for input(s): CKTOTAL, CKMB, CKMBINDEX, TROPONINI in the last 168 hours.  BNP (last 3 results) No results for input(s): BNP  in the last 8760 hours.  ProBNP (last 3 results) No results for input(s): PROBNP in the last 8760 hours.  Radiological Exams: No results found.  Assessment/Plan Active Problems:   Acute on chronic respiratory failure with hypoxia (HCC)   Traumatic brain injury with loss of consciousness (HCC)   Chronic post traumatic encephalopathy   Paroxysmal tachycardia, unspecified (HCC)   Traumatic intracranial hemorrhage with loss of consciousness, sequela ([place])   1. Acute on chronic respiratory failure with hypoxia we will continue with T collar trials titrate oxygen continue pulmonary toilet 2. Traumatic brain injury grossly unchanged we will continue present management 3. Chronic encephalopathy unchanged we will continue supportive care 4. Paroxysmal atrial fibrillation rate is controlled at this time 5. Traumatic intracranial hemorrhage supportive care   I have personally seen and evaluated the patient, evaluated laboratory and imaging results, formulated the assessment and plan and placed orders. The Patient requires high complexity decision making for assessment and support.  Case was discussed on Rounds with the Respiratory Therapy Staff  Allyne Gee, MD [facility] Pulmonary Critical Care Medicine Sleep Medicine

## 2019-03-15 DIAGNOSIS — F0781 Postconcussional syndrome: Secondary | ICD-10-CM | POA: Diagnosis not present

## 2019-03-15 DIAGNOSIS — J9621 Acute and chronic respiratory failure with hypoxia: Secondary | ICD-10-CM | POA: Diagnosis not present

## 2019-03-15 DIAGNOSIS — S06309S Unspecified focal traumatic brain injury with loss of consciousness of unspecified duration, sequela: Secondary | ICD-10-CM | POA: Diagnosis not present

## 2019-03-15 DIAGNOSIS — I479 Paroxysmal tachycardia, unspecified: Secondary | ICD-10-CM | POA: Diagnosis not present

## 2019-03-15 NOTE — Progress Notes (Signed)
Pulmonary Critical Care Medicine [place]   PULMONARY CRITICAL CARE SERVICE  PROGRESS NOTE  Date of Service: 03/15/2019  Mathew Klein  [id number]  DOB: 1991/05/23   DOA: 02/23/2019  Referring Physician: Merton Border, MD  HPI: Mathew Klein is a 28 y.o. male seen for follow up of Acute on Chronic Respiratory Failure.  Patient is capping currently is on room air has been capping for more than 24 hours  Medications: Reviewed on Rounds  Physical Exam:  Vitals: Temperature 97.4 pulse 91 respiratory rate 12 blood pressure 138/75 saturations 97%  Ventilator Settings off the ventilator capping  . General: Comfortable at this time . Eyes: Grossly normal lids, irises & conjunctiva . ENT: grossly tongue is normal . Neck: no obvious mass . Cardiovascular: S1 S2 normal no gallop . Respiratory: No rhonchi coarse breath sounds . Abdomen: soft . Skin: no rash seen on limited exam . Musculoskeletal: not rigid . Psychiatric:unable to assess . Neurologic: no seizure no involuntary movements         Lab Data:   Basic Metabolic Panel: Recent Labs  Lab 03/13/19 0617  NA 138  K 4.2  CL 100  CO2 30  GLUCOSE 118*  BUN 10  CREATININE 0.75  CALCIUM 9.8  MG 1.9    ABG: No results for input(s): PHART, PCO2ART, PO2ART, HCO3, O2SAT in the last 168 hours.  Liver Function Tests: No results for input(s): AST, ALT, ALKPHOS, BILITOT, PROT, ALBUMIN in the last 168 hours. No results for input(s): LIPASE, AMYLASE in the last 168 hours. No results for input(s): AMMONIA in the last 168 hours.  CBC: Recent Labs  Lab 03/13/19 0617  WBC 5.6  HGB 11.3*  HCT 35.2*  MCV 91.2  PLT 280    Cardiac Enzymes: No results for input(s): CKTOTAL, CKMB, CKMBINDEX, TROPONINI in the last 168 hours.  BNP (last 3 results) No results for input(s): BNP in the last 8760 hours.  ProBNP (last 3 results) No results for input(s): PROBNP in the last 8760  hours.  Radiological Exams: No results found.  Assessment/Plan Active Problems:   Acute on chronic respiratory failure with hypoxia (HCC)   Traumatic brain injury with loss of consciousness (HCC)   Chronic post traumatic encephalopathy   Paroxysmal tachycardia, unspecified (HCC)   Traumatic intracranial hemorrhage with loss of consciousness, sequela ([place])   1. Acute on chronic respiratory failure hypoxia we will continue with capping trials as ordered working towards eventual decannulation hopefully 2. Traumatic brain injury grossly unchanged 3. Chronic posttraumatic encephalopathy no major changes are noted 4. Paroxysmal tachycardia rate is controlled 5. Traumatic intracranial hemorrhage patient is at baseline we will continue to monitor closely   I have personally seen and evaluated the patient, evaluated laboratory and imaging results, formulated the assessment and plan and placed orders. The Patient requires high complexity decision making for assessment and support.  Case was discussed on Rounds with the Respiratory Therapy Staff  Allyne Gee, MD [facility] Pulmonary Critical Care Medicine Sleep Medicine

## 2019-03-16 ENCOUNTER — Other Ambulatory Visit (HOSPITAL_COMMUNITY): Payer: BC Managed Care – PPO

## 2019-03-16 ENCOUNTER — Inpatient Hospital Stay (HOSPITAL_COMMUNITY)
Admission: AD | Admit: 2019-03-16 | Discharge: 2019-03-16 | Disposition: A | Discharge status: Home / Self Care | Payer: BC Managed Care – PPO | Attending: Internal Medicine | Admitting: Internal Medicine

## 2019-03-16 DIAGNOSIS — F0781 Postconcussional syndrome: Secondary | ICD-10-CM | POA: Diagnosis not present

## 2019-03-16 DIAGNOSIS — I479 Paroxysmal tachycardia, unspecified: Secondary | ICD-10-CM | POA: Diagnosis not present

## 2019-03-16 DIAGNOSIS — J9621 Acute and chronic respiratory failure with hypoxia: Secondary | ICD-10-CM | POA: Diagnosis not present

## 2019-03-16 DIAGNOSIS — S06309S Unspecified focal traumatic brain injury with loss of consciousness of unspecified duration, sequela: Secondary | ICD-10-CM | POA: Diagnosis not present

## 2019-03-16 LAB — CBC
HCT: 40.7 % (ref 39.0–52.0)
Hemoglobin: 13.2 g/dL (ref 13.0–17.0)
MCH: 29.8 pg (ref 26.0–34.0)
MCHC: 32.4 g/dL (ref 30.0–36.0)
MCV: 91.9 fL (ref 80.0–100.0)
Platelets: 324 10*3/uL (ref 150–400)
RBC: 4.43 MIL/uL (ref 4.22–5.81)
RDW: 14.1 % (ref 11.5–15.5)
WBC: 6.7 10*3/uL (ref 4.0–10.5)
nRBC: 0 % (ref 0.0–0.2)

## 2019-03-16 LAB — BASIC METABOLIC PANEL
Anion gap: 11 (ref 5–15)
BUN: 14 mg/dL (ref 6–20)
CO2: 27 mmol/L (ref 22–32)
Calcium: 9.9 mg/dL (ref 8.9–10.3)
Chloride: 102 mmol/L (ref 98–111)
Creatinine, Ser: 0.74 mg/dL (ref 0.61–1.24)
GFR calc Af Amer: >60 mL/min (ref >60)
GFR calc non Af Amer: >60 mL/min (ref >60)
Glucose, Bld: 125 mg/dL — ABNORMAL HIGH (ref 70–99)
Potassium: 4.4 mmol/L (ref 3.5–5.1)
Sodium: 140 mmol/L (ref 135–145)

## 2019-03-16 LAB — MAGNESIUM: Magnesium: 2 mg/dL (ref 1.7–2.4)

## 2019-03-16 MED ORDER — GENERIC EXTERNAL MEDICATION
Status: DC
Start: ? — End: 2019-03-16

## 2019-03-16 NOTE — Procedures (Signed)
Patient Name: Mathew Klein  MRN: [id number]  Epilepsy Attending: Lora Havens  Referring Physician/Provider: Dr Dalphine Handing Date: 03/16/2019 Duration: 26.39 mins  Patient history: 28yo M with TBI and ams. EEG to evaluate for seizure.   Level of alertness: awake  AEDs during EEG study: None  Technical aspects: This EEG study was done with scalp electrodes positioned according to the 10-20 International system of electrode placement. Electrical activity was acquired at a sampling rate of 500Hz  and reviewed with a high frequency filter of 70Hz  and a low frequency filter of 1Hz . EEG data were recorded continuously and digitally stored.   DESCRIPTION:  The posterior dominant rhythm consists of 8 Hz activity of moderate voltage (25-35 uV) seen predominantly in posterior head regions, symmetric and reactive to eye opening and eye closing.   There was continuous 2-3hz  delta slowing with overriding fast activity in left frontocentral region suggestive of breech artifact.  Hyperventilation and photic stimulation were not performed.  At 1626, patient was noted to be crying. Per tech annotation this was the event team was concerned about. Concomitant EEG before, during and after the event didn't show any EEG change to suggest seizure.  IMPRESSION: This study is suggestive of cortical dysfunction in left frontocentral region consistent with prior craniotomy. No seizures or epileptiform discharges were seen throughout the recording.  One episode of crying was recorded at 1626 with no EEG change to suggest seizure.   Mathew Klein

## 2019-03-16 NOTE — Progress Notes (Addendum)
Pulmonary Critical Care Medicine [place]   PULMONARY CRITICAL CARE SERVICE  PROGRESS NOTE  Date of Service: 03/16/2019  Mathew Klein  [id number]  DOB: March 07, 1991   DOA: 02/23/2019  Referring Physician: Merton Border, MD  HPI: Mathew Klein is a 28 y.o. male seen for follow up of Acute on Chronic Respiratory Failure.  Patient remains On room air now for 48 hours satting well with no fever or distress.  Medications: Reviewed on Rounds  Physical Exam:  Vitals: Pulse 103 respirations 14 BP 150/86 O2 sat 94% temp 97.2  Ventilator Settings room air  . General: Comfortable at this time . Eyes: Grossly normal lids, irises & conjunctiva . ENT: grossly tongue is normal . Neck: no obvious mass . Cardiovascular: S1 S2 normal no gallop . Respiratory: No rales or rhonchi noted . Abdomen: soft . Skin: no rash seen on limited exam . Musculoskeletal: not rigid . Psychiatric:unable to assess . Neurologic: no seizure no involuntary movements         Lab Data:   Basic Metabolic Panel: Recent Labs  Lab 03/13/19 0617 03/16/19 1420  NA 138 140  K 4.2 4.4  CL 100 102  CO2 30 27  GLUCOSE 118* 125*  BUN 10 14  CREATININE 0.75 0.74  CALCIUM 9.8 9.9  MG 1.9 2.0    ABG: No results for input(s): PHART, PCO2ART, PO2ART, HCO3, O2SAT in the last 168 hours.  Liver Function Tests: No results for input(s): AST, ALT, ALKPHOS, BILITOT, PROT, ALBUMIN in the last 168 hours. No results for input(s): LIPASE, AMYLASE in the last 168 hours. No results for input(s): AMMONIA in the last 168 hours.  CBC: Recent Labs  Lab 03/13/19 0617 03/16/19 1420  WBC 5.6 6.7  HGB 11.3* 13.2  HCT 35.2* 40.7  MCV 91.2 91.9  PLT 280 324    Cardiac Enzymes: No results for input(s): CKTOTAL, CKMB, CKMBINDEX, TROPONINI in the last 168 hours.  BNP (last 3 results) No results for input(s): BNP in the last 8760 hours.  ProBNP (last 3 results) No results for input(s):  PROBNP in the last 8760 hours.  Radiological Exams: Dg Chest Port 1 View  Result Date: 03/16/2019 CLINICAL DATA:  Possible aspiration. EXAM: PORTABLE CHEST 1 VIEW COMPARISON:  Chest radiograph 02/23/2019 FINDINGS: Monitoring leads overlie the patient. Tracheostomy tube terminates in the mid trachea. Focal opacity right mid lung. Stable cardiac and mediastinal contours. No large area pulmonary consolidation. No pleural effusion or pneumothorax. IMPRESSION: Tracheostomy tube mid trachea. Focal opacity right mid lung may represent overlapping structures or potentially pulmonary consolidation or infectious/inflammatory process. Electronically Signed   By: Lovey Newcomer M.D.   On: 03/16/2019 14:58    Assessment/Plan Active Problems:   Acute on chronic respiratory failure with hypoxia (HCC)   Traumatic brain injury with loss of consciousness (HCC)   Chronic post traumatic encephalopathy   Paroxysmal tachycardia, unspecified ([place])   Traumatic intracranial hemorrhage with loss of consciousness, sequela ([place])   1. Acute on chronic respiratory failure hypoxia patient is capped and on room air satting well continue supportive measures at this time. 2. Traumatic brain injury grossly unchanged 3. Chronic posttraumatic encephalopathy no major changes are noted 4. Paroxysmal tachycardia rate is controlled 5. Traumatic intracranial hemorrhage patient is at baseline we will continue to monitor closely   I have personally seen and evaluated the patient, evaluated laboratory and imaging results, formulated the assessment and plan and placed orders. The Patient requires high complexity decision making for  assessment and support.  Case was discussed on Rounds with the Respiratory Therapy Staff  Allyne Gee, MD [facility] Pulmonary Critical Care Medicine Sleep Medicine

## 2019-03-16 NOTE — Progress Notes (Signed)
EEG complete - results pending 

## 2019-03-17 DIAGNOSIS — S06309S Unspecified focal traumatic brain injury with loss of consciousness of unspecified duration, sequela: Secondary | ICD-10-CM | POA: Diagnosis not present

## 2019-03-17 DIAGNOSIS — F0781 Postconcussional syndrome: Secondary | ICD-10-CM | POA: Diagnosis not present

## 2019-03-17 DIAGNOSIS — J9621 Acute and chronic respiratory failure with hypoxia: Secondary | ICD-10-CM | POA: Diagnosis not present

## 2019-03-17 DIAGNOSIS — I479 Paroxysmal tachycardia, unspecified: Secondary | ICD-10-CM | POA: Diagnosis not present

## 2019-03-17 NOTE — Progress Notes (Addendum)
Pulmonary Critical Care Medicine [place]   PULMONARY CRITICAL CARE SERVICE  PROGRESS NOTE  Date of Service: 03/17/2019  Mathew Klein  [id number]  DOB: 03/11/1991   DOA: 02/23/2019  Referring Physician: Merton Border, MD  HPI: Mathew Klein is a 28 y.o. male seen for follow up of Acute on Chronic Respiratory Failure.  Patient remains capped on room air.  Medications: Reviewed on Rounds  Physical Exam:  Vitals: Pulse 112 respiration 16 BP 130/80 O2 sat 95% temp 98.5  Ventilator Settings room air  . General: Comfortable at this time . Eyes: Grossly normal lids, irises & conjunctiva . ENT: grossly tongue is normal . Neck: no obvious mass . Cardiovascular: S1 S2 normal no gallop . Respiratory: No rales or rhonchi noted . Abdomen: soft . Skin: no rash seen on limited exam . Musculoskeletal: not rigid . Psychiatric:unable to assess . Neurologic: no seizure no involuntary movements         Lab Data:   Basic Metabolic Panel: Recent Labs  Lab 03/13/19 0617 03/16/19 1420  NA 138 140  K 4.2 4.4  CL 100 102  CO2 30 27  GLUCOSE 118* 125*  BUN 10 14  CREATININE 0.75 0.74  CALCIUM 9.8 9.9  MG 1.9 2.0    ABG: No results for input(s): PHART, PCO2ART, PO2ART, HCO3, O2SAT in the last 168 hours.  Liver Function Tests: No results for input(s): AST, ALT, ALKPHOS, BILITOT, PROT, ALBUMIN in the last 168 hours. No results for input(s): LIPASE, AMYLASE in the last 168 hours. No results for input(s): AMMONIA in the last 168 hours.  CBC: Recent Labs  Lab 03/13/19 0617 03/16/19 1420  WBC 5.6 6.7  HGB 11.3* 13.2  HCT 35.2* 40.7  MCV 91.2 91.9  PLT 280 324    Cardiac Enzymes: No results for input(s): CKTOTAL, CKMB, CKMBINDEX, TROPONINI in the last 168 hours.  BNP (last 3 results) No results for input(s): BNP in the last 8760 hours.  ProBNP (last 3 results) No results for input(s): PROBNP in the last 8760 hours.  Radiological  Exams: Ct Head Wo Contrast  Result Date: 03/16/2019 CLINICAL DATA:  28 year old male with unexplained altered mental status. History of craniectomy. EXAM: CT HEAD WITHOUT CONTRAST TECHNIQUE: Contiguous axial images were obtained from the base of the skull through the vertex without intravenous contrast. COMPARISON:  Cervical spine CT 03/03/2019. FINDINGS: Brain: Small left parafalcine subdural hygroma measures 10 millimeters in thickness on series 3, image 26. Mild mass effect on the left hemisphere along with the sequelae of craniectomy and left side dural repair. There is multifocal encephalomalacia also in the left and temporal lobes, including on series 3, image 16. Basilar cisterns remain patent. No ventriculomegaly. There is contralateral right anterior temporal and inferior frontal gyrus encephalomalacia. No hyperdense intracranial hemorrhage. No acute cortically based infarcts suspected. Vascular: No suspicious intracranial vascular hyperdensity. Skull: Previous left hemi craniectomy. No acute osseous abnormality identified. Sinuses/Orbits: Scattered mild paranasal sinus mucosal thickening. Mild mastoid effusions. Tympanic cavities are clear. Other: No acute scalp or orbits soft tissue findings. There is leftward gaze. IMPRESSION: 1. Sequelae of left hemi-craniectomy and left greater than right frontotemporal encephalomalacia typical in appearance of posttraumatic cerebral contusions. 2. 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 3. No superimposed acute intracranial abnormality identified. Electronically Signed   By: Genevie Ann M.D.   On: 03/16/2019 17:48   Dg Chest Port 1 View  Result Date: 03/16/2019 CLINICAL DATA:  Possible aspiration. EXAM: PORTABLE CHEST 1  VIEW COMPARISON:  Chest radiograph 02/23/2019 FINDINGS: Monitoring leads overlie the patient. Tracheostomy tube terminates in the mid trachea. Focal opacity right mid lung. Stable cardiac and mediastinal contours. No large  area pulmonary consolidation. No pleural effusion or pneumothorax. IMPRESSION: Tracheostomy tube mid trachea. Focal opacity right mid lung may represent overlapping structures or potentially pulmonary consolidation or infectious/inflammatory process. Electronically Signed   By: Annia Beltrew  Davis M.D.   On: 03/16/2019 14:58    Assessment/Plan Active Problems:   Acute on chronic respiratory failure with hypoxia (HCC)   Traumatic brain injury with loss of consciousness (HCC)   Chronic post traumatic encephalopathy   Paroxysmal tachycardia, unspecified (HCC)   Traumatic intracranial hemorrhage with loss of consciousness, sequela (HCC)   1. Acute on chronic respiratory failure hypoxia patient is capped and on room air satting well continue supportive measures at this time. 2. Traumatic brain injury grossly unchanged 3. Chronic posttraumatic encephalopathy no major changes are noted 4. Paroxysmal tachycardia rate is controlled 5. Traumatic intracranial hemorrhage patient is at baseline we will continue to monitor closely   I have personally seen and evaluated the patient, evaluated laboratory and imaging results, formulated the assessment and plan and placed orders. The Patient requires high complexity decision making for assessment and support.  Case was discussed on Rounds with the Respiratory Therapy Staff  Yevonne PaxSaadat A Chenoa Luddy, MD [facility] Pulmonary Critical Care Medicine Sleep Medicine

## 2019-03-18 DIAGNOSIS — J9621 Acute and chronic respiratory failure with hypoxia: Secondary | ICD-10-CM | POA: Diagnosis not present

## 2019-03-18 DIAGNOSIS — I479 Paroxysmal tachycardia, unspecified: Secondary | ICD-10-CM | POA: Diagnosis not present

## 2019-03-18 DIAGNOSIS — F0781 Postconcussional syndrome: Secondary | ICD-10-CM | POA: Diagnosis not present

## 2019-03-18 DIAGNOSIS — S06309S Unspecified focal traumatic brain injury with loss of consciousness of unspecified duration, sequela: Secondary | ICD-10-CM | POA: Diagnosis not present

## 2019-03-18 NOTE — Progress Notes (Addendum)
Pulmonary Critical Care Medicine [place]   PULMONARY CRITICAL CARE SERVICE  PROGRESS NOTE  Date of Service: 03/18/2019  Mathew Klein  TIW:[id number]  DOB: [birth year]   DOA: 02/23/2019  Referring Physician: Merton Border, MD  HPI: Mathew Klein is a 28 y.o. male seen for follow up of Acute on Chronic Respiratory Failure.  Patient remains capped on room air at this time satting well with no distress.  Medications: Reviewed on Rounds  Physical Exam:  Vitals: Pulse 94 respirations 15 BP 137/80 O2 sat 95% temp 98.9  Ventilator Settings room air  . General: Comfortable at this time . Eyes: Grossly normal lids, irises & conjunctiva . ENT: grossly tongue is normal . Neck: no obvious mass . Cardiovascular: S1 S2 normal no gallop . Respiratory: No rales or rhonchi noted . Abdomen: soft . Skin: no rash seen on limited exam . Musculoskeletal: not rigid . Psychiatric:unable to assess . Neurologic: no seizure no involuntary movements         Lab Data:   Basic Metabolic Panel: Recent Labs  Lab 03/13/19 0617 03/16/19 1420  NA 138 140  K 4.2 4.4  CL 100 102  CO2 30 27  GLUCOSE 118* 125*  BUN 10 14  CREATININE 0.75 0.74  CALCIUM 9.8 9.9  MG 1.9 2.0    ABG: No results for input(s): PHART, PCO2ART, PO2ART, HCO3, O2SAT in the last 168 hours.  Liver Function Tests: No results for input(s): AST, ALT, ALKPHOS, BILITOT, PROT, ALBUMIN in the last 168 hours. No results for input(s): LIPASE, AMYLASE in the last 168 hours. No results for input(s): AMMONIA in the last 168 hours.  CBC: Recent Labs  Lab 03/13/19 0617 03/16/19 1420  WBC 5.6 6.7  HGB 11.3* 13.2  HCT 35.2* 40.7  MCV 91.2 91.9  PLT 280 324    Cardiac Enzymes: No results for input(s): CKTOTAL, CKMB, CKMBINDEX, TROPONINI in the last 168 hours.  BNP (last 3 results) No results for input(s): BNP in the last 8760 hours.  ProBNP (last 3 results) No results for input(s): PROBNP  in the last 8760 hours.  Radiological Exams: No results found.  Assessment/Plan Active Problems:   Acute on chronic respiratory failure with hypoxia (HCC)   Traumatic brain injury with loss of consciousness (HCC)   Chronic post traumatic encephalopathy   Paroxysmal tachycardia, unspecified (HCC)   Traumatic intracranial hemorrhage with loss of consciousness, sequela ([place])   1. Acute on chronic respiratory failure hypoxia patient is capped and on room air satting well continue supportive measures at this time. 2. Traumatic brain injury grossly unchanged 3. Chronic posttraumatic encephalopathy no major changes are noted 4. Paroxysmal tachycardia rate is controlled 5. Traumatic intracranial hemorrhage patient is at baseline we will continue to monitor closely   I have personally seen and evaluated the patient, evaluated laboratory and imaging results, formulated the assessment and plan and placed orders. The Patient requires high complexity decision making for assessment and support.  Case was discussed on Rounds with the Respiratory Therapy Staff  Allyne Gee, MD [facility] Pulmonary Critical Care Medicine Sleep Medicine

## 2019-03-19 DIAGNOSIS — F0781 Postconcussional syndrome: Secondary | ICD-10-CM | POA: Diagnosis not present

## 2019-03-19 DIAGNOSIS — S06309S Unspecified focal traumatic brain injury with loss of consciousness of unspecified duration, sequela: Secondary | ICD-10-CM | POA: Diagnosis not present

## 2019-03-19 DIAGNOSIS — I479 Paroxysmal tachycardia, unspecified: Secondary | ICD-10-CM | POA: Diagnosis not present

## 2019-03-19 DIAGNOSIS — J9621 Acute and chronic respiratory failure with hypoxia: Secondary | ICD-10-CM | POA: Diagnosis not present

## 2019-03-19 NOTE — Progress Notes (Addendum)
Pulmonary Critical Care Medicine [place]   PULMONARY CRITICAL CARE SERVICE  PROGRESS NOTE  Date of Service: 03/19/2019  Mathew Klein  PTW:[id number]  DOB: [birth year]   DOA: 02/23/2019  Referring Physician: Merton Border, MD  HPI: Mathew Klein.  Patient was decannulated today and is on room air at this time as well no distress.  Medications: Reviewed on Rounds  Physical Exam:  Vitals: Pulse 84 respirations 18 BP 113/76 O2 sat 99% temp 98.8  Ventilator Settings room air  . General: Comfortable at this time . Eyes: Grossly normal lids, irises & conjunctiva . ENT: grossly tongue is normal . Neck: no obvious mass . Cardiovascular: S1 S2 normal no gallop . Respiratory: No rales or rhonchi noted . Abdomen: soft . Skin: no rash seen on limited exam . Musculoskeletal: not rigid . Psychiatric:unable to assess . Neurologic: no seizure no involuntary movements         Lab Data:   Basic Metabolic Panel: Recent Labs  Lab 03/13/19 0617 03/16/19 1420  NA 138 140  K 4.2 4.4  CL 100 102  CO2 30 27  GLUCOSE 118* 125*  BUN 10 14  CREATININE 0.75 0.74  CALCIUM 9.8 9.9  MG 1.9 2.0    ABG: No results for input(s): PHART, PCO2ART, PO2ART, HCO3, O2SAT in the last 168 hours.  Liver Function Tests: No results for input(s): AST, ALT, ALKPHOS, BILITOT, PROT, ALBUMIN in the last 168 hours. No results for input(s): LIPASE, AMYLASE in the last 168 hours. No results for input(s): AMMONIA in the last 168 hours.  CBC: Recent Labs  Lab 03/13/19 0617 03/16/19 1420  WBC 5.6 6.7  HGB 11.3* 13.2  HCT 35.2* 40.7  MCV 91.2 91.9  PLT 280 324    Cardiac Enzymes: No results for input(s): CKTOTAL, CKMB, CKMBINDEX, TROPONINI in the last 168 hours.  BNP (last 3 results) No results for input(s): BNP in the last 8760 hours.  ProBNP (last 3 results) No results for input(s):  PROBNP in the last 8760 hours.  Radiological Exams: No results found.  Assessment/Plan Active Problems:   Acute on chronic respiratory Klein with hypoxia (HCC)   Traumatic brain injury with loss of consciousness (HCC)   Chronic post traumatic encephalopathy   Paroxysmal tachycardia, unspecified (HCC)   Traumatic intracranial hemorrhage with loss of consciousness, sequela ([place])   1. Acute on chronic respiratory Klein hypoxiapatient is capped and on room air satting well continue supportive measures at this time. 2. Traumatic brain injury grossly unchanged 3. Chronic posttraumatic encephalopathy no major changes are noted 4. Paroxysmal tachycardia rate is controlled 5. Traumatic intracranial hemorrhage patient is at baseline we will continue to monitor closely   I have personally seen and evaluated the patient, evaluated laboratory and imaging results, formulated the assessment and plan and placed orders. The Patient requires high complexity decision making for assessment and support.  Case was discussed on Rounds with the Respiratory Therapy Staff  Allyne Gee, MD [facility] Pulmonary Critical Care Medicine Sleep Medicine

## 2019-03-21 LAB — SARS CORONAVIRUS 2 BY RT PCR (HOSPITAL ORDER, PERFORMED IN ~~LOC~~ HOSPITAL LAB): SARS Coronavirus 2: NEGATIVE
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
